# Patient Record
Sex: Male | Born: 1955 | Race: White | Hispanic: No | Marital: Married
Health system: Midwestern US, Academic
[De-identification: ages and names within clinical notes are randomized; demographics above are authoritative.]

---

## 2017-07-09 ENCOUNTER — Encounter: Admit: 2017-07-09 | Discharge: 2017-07-09 | Payer: BC Managed Care – PPO

## 2017-07-09 DIAGNOSIS — I214 Non-ST elevation (NSTEMI) myocardial infarction: Principal | ICD-10-CM

## 2017-07-09 DIAGNOSIS — I2510 Atherosclerotic heart disease of native coronary artery without angina pectoris: ICD-10-CM

## 2017-07-09 DIAGNOSIS — I10 Essential (primary) hypertension: ICD-10-CM

## 2017-07-09 DIAGNOSIS — Z720 Tobacco use: ICD-10-CM

## 2017-07-09 DIAGNOSIS — E785 Hyperlipidemia, unspecified: ICD-10-CM

## 2017-07-17 ENCOUNTER — Encounter: Admit: 2017-07-17 | Discharge: 2017-07-17 | Payer: BC Managed Care – PPO

## 2017-07-17 ENCOUNTER — Ambulatory Visit: Admit: 2017-07-17 | Discharge: 2017-07-18 | Payer: BC Managed Care – PPO

## 2017-07-17 DIAGNOSIS — K089 Disorder of teeth and supporting structures, unspecified: ICD-10-CM

## 2017-07-17 DIAGNOSIS — I10 Essential (primary) hypertension: ICD-10-CM

## 2017-07-17 DIAGNOSIS — I25118 Atherosclerotic heart disease of native coronary artery with other forms of angina pectoris: Principal | ICD-10-CM

## 2017-07-17 DIAGNOSIS — I214 Non-ST elevation (NSTEMI) myocardial infarction: Principal | ICD-10-CM

## 2017-07-17 DIAGNOSIS — R072 Precordial pain: ICD-10-CM

## 2017-07-17 DIAGNOSIS — I739 Peripheral vascular disease, unspecified: ICD-10-CM

## 2017-07-17 DIAGNOSIS — I2510 Atherosclerotic heart disease of native coronary artery without angina pectoris: ICD-10-CM

## 2017-07-17 DIAGNOSIS — Z720 Tobacco use: ICD-10-CM

## 2017-07-17 DIAGNOSIS — E785 Hyperlipidemia, unspecified: ICD-10-CM

## 2017-07-17 DIAGNOSIS — R0989 Other specified symptoms and signs involving the circulatory and respiratory systems: ICD-10-CM

## 2017-07-25 ENCOUNTER — Encounter: Admit: 2017-07-25 | Discharge: 2017-07-25 | Payer: BC Managed Care – PPO

## 2017-07-25 ENCOUNTER — Ambulatory Visit: Admit: 2017-07-25 | Discharge: 2017-07-26 | Payer: BC Managed Care – PPO

## 2017-07-25 DIAGNOSIS — M79609 Pain in unspecified limb: ICD-10-CM

## 2017-07-25 DIAGNOSIS — I214 Non-ST elevation (NSTEMI) myocardial infarction: ICD-10-CM

## 2017-07-25 DIAGNOSIS — Z720 Tobacco use: ICD-10-CM

## 2017-07-25 DIAGNOSIS — I25118 Atherosclerotic heart disease of native coronary artery with other forms of angina pectoris: Principal | ICD-10-CM

## 2017-07-25 DIAGNOSIS — R072 Precordial pain: ICD-10-CM

## 2017-07-25 DIAGNOSIS — E785 Hyperlipidemia, unspecified: ICD-10-CM

## 2017-07-25 DIAGNOSIS — I739 Peripheral vascular disease, unspecified: ICD-10-CM

## 2017-07-25 DIAGNOSIS — I10 Essential (primary) hypertension: ICD-10-CM

## 2017-07-25 DIAGNOSIS — R0989 Other specified symptoms and signs involving the circulatory and respiratory systems: ICD-10-CM

## 2017-07-25 DIAGNOSIS — K089 Disorder of teeth and supporting structures, unspecified: ICD-10-CM

## 2017-07-30 ENCOUNTER — Ambulatory Visit: Admit: 2017-07-30 | Discharge: 2017-07-30 | Payer: BC Managed Care – PPO

## 2017-07-30 ENCOUNTER — Ambulatory Visit: Admit: 2017-07-30 | Discharge: 2017-07-31 | Payer: BC Managed Care – PPO

## 2017-07-30 DIAGNOSIS — R0989 Other specified symptoms and signs involving the circulatory and respiratory systems: ICD-10-CM

## 2017-07-30 DIAGNOSIS — I25118 Atherosclerotic heart disease of native coronary artery with other forms of angina pectoris: Principal | ICD-10-CM

## 2017-07-30 DIAGNOSIS — I739 Peripheral vascular disease, unspecified: ICD-10-CM

## 2017-07-30 DIAGNOSIS — E785 Hyperlipidemia, unspecified: ICD-10-CM

## 2017-07-30 DIAGNOSIS — K089 Disorder of teeth and supporting structures, unspecified: ICD-10-CM

## 2017-07-30 DIAGNOSIS — I10 Essential (primary) hypertension: ICD-10-CM

## 2017-07-30 DIAGNOSIS — I214 Non-ST elevation (NSTEMI) myocardial infarction: ICD-10-CM

## 2017-07-30 DIAGNOSIS — R072 Precordial pain: ICD-10-CM

## 2017-07-30 DIAGNOSIS — Z720 Tobacco use: ICD-10-CM

## 2017-07-30 MED ORDER — REGADENOSON 0.4 MG/5 ML IV SYRG
.4 mg | Freq: Once | INTRAVENOUS | 0 refills | Status: CP
Start: 2017-07-30 — End: ?

## 2017-07-30 MED ORDER — ALBUTEROL SULFATE 90 MCG/ACTUATION IN HFAA
2 | RESPIRATORY_TRACT | 0 refills | Status: DC | PRN
Start: 2017-07-30 — End: 2017-08-04

## 2017-07-30 MED ORDER — AMINOPHYLLINE 500 MG/20 ML IV SOLN
50 mg | INTRAVENOUS | 0 refills | Status: AC | PRN
Start: 2017-07-30 — End: ?

## 2017-07-30 MED ORDER — SODIUM CHLORIDE 0.9 % IV SOLP
250 mL | INTRAVENOUS | 0 refills | Status: AC | PRN
Start: 2017-07-30 — End: ?

## 2017-07-30 MED ORDER — PERFLUTREN LIPID MICROSPHERES 1.1 MG/ML IV SUSP
1-20 mL | Freq: Once | INTRAVENOUS | 0 refills | Status: CP
Start: 2017-07-30 — End: ?

## 2017-07-30 MED ORDER — NITROGLYCERIN 0.4 MG SL SUBL
.4 mg | SUBLINGUAL | 0 refills | Status: AC | PRN
Start: 2017-07-30 — End: ?

## 2017-07-31 ENCOUNTER — Encounter: Admit: 2017-07-31 | Discharge: 2017-07-31 | Payer: BC Managed Care – PPO

## 2018-03-26 ENCOUNTER — Encounter: Admit: 2018-03-26 | Discharge: 2018-03-26 | Payer: BC Managed Care – PPO

## 2018-12-31 ENCOUNTER — Encounter: Admit: 2018-12-31 | Discharge: 2018-12-31

## 2019-01-02 ENCOUNTER — Encounter: Admit: 2019-01-02 | Discharge: 2019-01-02

## 2019-01-02 ENCOUNTER — Ambulatory Visit: Admit: 2019-01-02 | Discharge: 2019-01-03

## 2019-01-02 DIAGNOSIS — I214 Non-ST elevation (NSTEMI) myocardial infarction: Secondary | ICD-10-CM

## 2019-01-02 DIAGNOSIS — E042 Nontoxic multinodular goiter: Secondary | ICD-10-CM

## 2019-01-02 DIAGNOSIS — Z720 Tobacco use: Secondary | ICD-10-CM

## 2019-01-02 DIAGNOSIS — I2510 Atherosclerotic heart disease of native coronary artery without angina pectoris: Secondary | ICD-10-CM

## 2019-01-02 DIAGNOSIS — I25118 Atherosclerotic heart disease of native coronary artery with other forms of angina pectoris: Secondary | ICD-10-CM

## 2019-01-02 DIAGNOSIS — E785 Hyperlipidemia, unspecified: Secondary | ICD-10-CM

## 2019-01-02 DIAGNOSIS — I10 Essential (primary) hypertension: Secondary | ICD-10-CM

## 2019-01-02 DIAGNOSIS — E7800 Pure hypercholesterolemia, unspecified: Secondary | ICD-10-CM

## 2019-01-02 NOTE — Progress Notes
Date of Service: 01/02/2019    Jerry Forbes is a 63 y.o. male.       HPI     Patient is a 63 year old white male with a history of coronary artery disease.  He did suffer a myocardial infarction, he underwent PCI of the LAD on 12/20/2010, due to recurrent chest pain patient underwent another left heart catheterization in April 2013, this test resulted in PCI of the proximal LAD.  He was last seen in the office in February 2019.    Patient does not report symptoms of chest pain, no heart palpitations, he has not taken any sublingual nitroglycerin.    The most recent ischemic evaluation is dated July 30, 2017, it consisted of a myocardial perfusion imaging study, the perfusion pattern was negative for ischemia and ejection fraction was normal at 68%.  In addition, patient was also evaluated with a 2D echo Doppler study which revealed normal left ventricular systolic function, normal diastolic function and no valvular abnormalities.         Vitals:    01/02/19 1045 01/02/19 1048   BP: 128/72 130/72   BP Source: Arm, Right Upper Arm, Left Upper   Pulse: 63    Temp: 36.4 ???C (97.5 ???F)    SpO2: 99%    Weight: 83.9 kg (185 lb)    Height: 1.702 m (5' 7)    PainSc: Zero      Body mass index is 28.98 kg/m???.     Past Medical History  Patient Active Problem List    Diagnosis Date Noted   ??? Essential hypertension 01/21/2015   ??? Chest pain 09/13/2011   ??? Mediastinal abnormality 09/13/2011   ??? Multinodular goiter 09/13/2011     Enlarged Mediastinum on CXR - consistent with multinodular goiter   - CXR on 09/11/11 showed enlargement left superior mediastinum, consistent with thyroid enlargement, LAD, or mass. Increased soft tissue density overlying the right supraclavicular region, possibly LAD.   - Repeat CT 09/13/11 neck demonstrated enlarged heterogenous thyroid containing multiple nodules and masses, consistent with multinodular adenomatous goiter, causing some deviation of the trachea and concordant with soft tissue abnormality on CXR, no neck LAD   - CT chest - tiny left lower lobe pulmonary nodule - likely a small granuloma/scar. In a low risk patient no further follow up recommended   - TSH wnl this admit   - endo consulted for further tests, likely need to establish care with endo as outpatient as well   - given tobacco history (chew) will have pt follow up with PCP regarding the tiny left lower lobe nodule       ??? Coronary artery disease 05/18/2011     LHC 12/21/10:Subtotal 99 percent proximal LAD lesion, status post successful PCI with 2 overlapping drug-eluting stents, Xience 3.0 x 15 mm and a Xience 2.5 x 15 mm postdilated to 3.25 mm with excellent resul Normal LVEDP. Normal LV systolic function with an EF of 55 percent    LHC 09/12/11:A 95 percent proximal LAD lesion status post PCI with a Xience 3.5 x 12 mm overlapping with the previously placed proximal LAD stent and postdilated with a 3.5 mm balloon with excellent results, Mild disease in the left circumflex and the distal LAD.  Elevated LVEDP, Normal LV systolic function, EF of 55 percent.       ??? Tobacco chew use 01/17/2011   ??? Hyperlipemia 01/17/2011   ??? Poor dentition 01/17/2011   ??? NSTEMI (non-ST elevated myocardial infarction) (HCC) 12/20/2010   ???  HTN (hypertension) 12/20/2010         Review of Systems   Constitution: Negative.   HENT: Negative.    Eyes: Negative.    Cardiovascular: Negative.    Respiratory: Negative.    Endocrine: Negative.    Hematologic/Lymphatic: Negative.    Skin: Negative.    Musculoskeletal: Negative.    Gastrointestinal: Negative.    Genitourinary: Negative.    Neurological: Negative.    Psychiatric/Behavioral: Negative.    Allergic/Immunologic: Negative.        Physical Exam  General Appearance: normal in appearance  Skin: warm, moist, no ulcers or xanthomas  Eyes: conjunctivae and lids normal, pupils are equal and round  Lips & Oral Mucosa: no pallor or cyanosis Neck Veins: neck veins are flat, neck veins are not distended  Chest Inspection: chest is normal in appearance  Respiratory Effort: breathing comfortably, no respiratory distress  Auscultation/Percussion: lungs clear to auscultation, no rales or rhonchi, no wheezing  Cardiac Rhythm: regular rhythm and normal rate  Cardiac Auscultation: S1, S2 normal, no rub, no gallop  Murmurs: no murmur  Carotid Arteries: normal carotid upstroke bilaterally, no bruit  Lower Extremity Edema: no lower extremity edema  Abdominal Exam: soft, non-tender, no masses, bowel sounds normal  Liver & Spleen: no organomegaly  Language and Memory: patient responsive and seems to comprehend information  Neurologic Exam: neurological assessment grossly intact      Cardiovascular Studies  Twelve-lead EKG demonstrated normal sinus rhythm, no ST segment T wave changes    Problems Addressed Today  Encounter Diagnoses   Name Primary?   ??? NSTEMI (non-ST elevated myocardial infarction) (HCC) Yes   ??? Pure hypercholesterolemia    ??? Essential hypertension    ??? Coronary artery disease involving native coronary artery of native heart with other form of angina pectoris (HCC)    ??? CAD S/P percutaneous coronary angioplasty    ??? Tobacco chew use    ??? Multinodular goiter        Assessment and Plan     In summary: This is a 63 year old male with coronary artery disease, PCI of the LAD on 12/20/2010 and also 09/14/2011, patient was last evaluated with a perfusion imaging study in February 2019, the perfusion pattern was negative for ischemia.    He has not had recurrent symptoms of chest pain and no symptoms compatible with angina, patient remained stable.    Plan:    1.  Continue all current medications  2.  I did suggest risk factors modification, low-fat, low-cholesterol, low sugar, low carbohydrate and low salt diet  3.  Fasting lipid and liver profile and Chem-7  4.  Patient will be evaluated with a 2D echo Doppler study and a perfusion imaging study in March 2021, we will follow-up on the results and will call with further recommendations, due to busy harvest season, he will see me back in the office in July 2021.         Current Medications (including today's revisions)  ??? aspirin EC (ASPIRIN LOW DOSE) 81 mg tablet Take 1 Tab by mouth daily.   ??? clopiDOGrel (PLAVIX) 75 mg tablet TAKE ONE TABLET BY MOUTH ONCE DAILY   ??? lisinopril (PRINIVIL; ZESTRIL) 20 mg tablet Take 20 mg by mouth daily.     ??? meloxicam (MOBIC) 15 mg tablet Take 15 mg by mouth daily.   ??? metoprolol (LOPRESSOR) 50 mg tablet Take 1 Tab by mouth twice daily.   ??? nitroglycerin (NITROSTAT) 0.4 mg tablet Place 0.4 mg  under tongue every 5 minutes as needed.     ??? ranitidine(+) (ZANTAC) 150 mg tablet Take 150 mg by mouth twice daily.   ??? rosuvastatin (CRESTOR) 5 mg tablet Take 2 Tabs by mouth daily.   ??? tadalafil(+) (CIALIS) 10 mg tablet Take 10 mg by mouth as Needed.

## 2020-01-27 ENCOUNTER — Encounter: Admit: 2020-01-27 | Discharge: 2020-01-27 | Payer: BC Managed Care – PPO

## 2020-02-05 ENCOUNTER — Encounter: Admit: 2020-02-05 | Discharge: 2020-02-05 | Payer: BC Managed Care – PPO

## 2020-02-05 DIAGNOSIS — I10 Essential (primary) hypertension: Secondary | ICD-10-CM

## 2020-02-05 DIAGNOSIS — E7800 Pure hypercholesterolemia, unspecified: Secondary | ICD-10-CM

## 2020-02-05 DIAGNOSIS — Z720 Tobacco use: Secondary | ICD-10-CM

## 2020-02-05 DIAGNOSIS — E785 Hyperlipidemia, unspecified: Secondary | ICD-10-CM

## 2020-02-05 DIAGNOSIS — I2510 Atherosclerotic heart disease of native coronary artery without angina pectoris: Secondary | ICD-10-CM

## 2020-02-05 DIAGNOSIS — K089 Disorder of teeth and supporting structures, unspecified: Secondary | ICD-10-CM

## 2020-02-05 DIAGNOSIS — I214 Non-ST elevation (NSTEMI) myocardial infarction: Secondary | ICD-10-CM

## 2020-02-05 DIAGNOSIS — Z289 Immunization not carried out for unspecified reason: Secondary | ICD-10-CM

## 2020-02-05 DIAGNOSIS — I25118 Atherosclerotic heart disease of native coronary artery with other forms of angina pectoris: Secondary | ICD-10-CM

## 2020-02-05 NOTE — Progress Notes
Date of Service: 02/05/2020    Jerry Forbes is a 64 y.o. male.       HPI     Patient is a 64 year old white male that has a history of coronary artery disease.    Patient did suffer an MI, he underwent PCI of the LAD on 12/20/2010.  He did experience recurrent chest pain, he underwent another LHC in April 2013, this test resulted in PCI of the proximal LAD.  He has been on dual antiplatelet therapy ever since and would like to continue it.    He has not had any recurrent chest pain currently, has not had angina and has not taken sublingual nitroglycerin.  He was last evaluated with a perfusion imaging study in February 2019, the tomographic pattern did not demonstrate ischemia, the systolic function was normal.  An echocardiogram performed at the same time was overall unremarkable as well.    Patient complains of having bilateral knee pain due to osteoarthritis.  He does not report chest pain, no heart palpitations, has not taken sublingual nitroglycerin.         Vitals:    02/05/20 1610   BP: 136/82   Weight: 85.7 kg (189 lb)   Height: 1.702 m (5' 7)   PainSc: Zero     Body mass index is 29.6 kg/m?Marland Kitchen     Past Medical History  Patient Active Problem List    Diagnosis Date Noted   ? CAD S/P percutaneous coronary angioplasty    ? Essential hypertension 01/21/2015   ? Chest pain 09/13/2011   ? Mediastinal abnormality 09/13/2011   ? Multinodular goiter 09/13/2011     Enlarged Mediastinum on CXR - consistent with multinodular goiter   - CXR on 09/11/11 showed enlargement left superior mediastinum, consistent with thyroid enlargement, LAD, or mass. Increased soft tissue density overlying the right supraclavicular region, possibly LAD.   - Repeat CT 09/13/11 neck demonstrated enlarged heterogenous thyroid containing multiple nodules and masses, consistent with multinodular adenomatous goiter, causing some deviation of the trachea and concordant with soft tissue abnormality on CXR, no neck LAD   - CT chest - tiny left lower lobe pulmonary nodule - likely a small granuloma/scar. In a low risk patient no further follow up recommended   - TSH wnl this admit   - endo consulted for further tests, likely need to establish care with endo as outpatient as well   - given tobacco history (chew) will have pt follow up with PCP regarding the tiny left lower lobe nodule       ? Coronary artery disease 05/18/2011     LHC 12/21/10:Subtotal 99 percent proximal LAD lesion, status post successful PCI with 2 overlapping drug-eluting stents, Xience 3.0 x 15 mm and a Xience 2.5 x 15 mm postdilated to 3.25 mm with excellent resul Normal LVEDP. Normal LV systolic function with an EF of 55 percent    LHC 09/12/11:A 95 percent proximal LAD lesion status post PCI with a Xience 3.5 x 12 mm overlapping with the previously placed proximal LAD stent and postdilated with a 3.5 mm balloon with excellent results, Mild disease in the left circumflex and the distal LAD.  Elevated LVEDP, Normal LV systolic function, EF of 55 percent.       ? Tobacco chew use 01/17/2011   ? Hyperlipemia 01/17/2011   ? Poor dentition 01/17/2011   ? NSTEMI (non-ST elevated myocardial infarction) (HCC) 12/20/2010   ? HTN (hypertension) 12/20/2010  Review of Systems   Constitutional: Negative.   HENT: Negative.    Eyes: Negative.    Cardiovascular: Negative.    Respiratory: Negative.    Endocrine: Negative.    Hematologic/Lymphatic: Negative.    Skin: Negative.    Musculoskeletal: Negative.    Gastrointestinal: Negative.    Genitourinary: Negative.    Neurological: Negative.    Psychiatric/Behavioral: Negative.    Allergic/Immunologic: Negative.        Physical Exam  General Appearance: normal in appearance  Skin: warm, moist, no ulcers or xanthomas  Eyes: conjunctivae and lids normal, pupils are equal and round  Lips & Oral Mucosa: no pallor or cyanosis  Neck Veins: neck veins are flat, neck veins are not distended  Chest Inspection: chest is normal in appearance  Respiratory Effort: breathing comfortably, no respiratory distress  Auscultation/Percussion: lungs clear to auscultation, no rales or rhonchi, no wheezing  Cardiac Rhythm: regular rhythm and normal rate  Cardiac Auscultation: S1, S2 normal, no rub, no gallop  Murmurs: no murmur  Carotid Arteries: normal carotid upstroke bilaterally, no bruit  Lower Extremity Edema: no lower extremity edema  Abdominal Exam: soft, non-tender, no masses, bowel sounds normal  Liver & Spleen: no organomegaly  Language and Memory: patient responsive and seems to comprehend information  Neurologic Exam: neurological assessment grossly intact      Cardiovascular Studies  Twelve-lead EKG demonstrated normal sinus rhythm, no ST segment T wave changes    Problems Addressed Today  Encounter Diagnoses   Name Primary?   ? CAD S/P percutaneous coronary angioplasty Yes   ? Coronary artery disease involving native coronary artery of native heart with other form of angina pectoris (HCC)    ? Essential hypertension    ? Pure hypercholesterolemia    ? NSTEMI (non-ST elevated myocardial infarction) (HCC)    ? Poor dentition    ? Tobacco chew use    ? COVID-19 vaccine dose not administered        Assessment and Plan     In summary: This is a 64 year old white male with stable CAD, s/p PCI of the LAD on 12/20/2010, history of recurrent chest pain is s/p repeat heart catheterization and PCI to the proximal LAD on 09/14/2011.  Patient remained angina free and has not taken sublingual nitroglycerin.  A perfusion imaging study performed in February 2019 did not demonstrate ischemia, he is known to have normal LV systolic function.    Plan:    1.  Continue current medications  2.  Goal LDL is 70 mg/dL or lower  3.  Patient would like to continue the dual antiplatelet therapy  4.  Further evaluation with a 2D echo Doppler study and perfusion imaging study in November?December 2021 between Thanksgiving and Christmas?we will follow up on the results of the test and will make further recommendations.  5.  Follow-up office visit in approximately 1 year.8         Current Medications (including today's revisions)  ? aspirin EC (ASPIRIN LOW DOSE) 81 mg tablet Take 1 Tab by mouth daily.   ? clopiDOGrel (PLAVIX) 75 mg tablet TAKE ONE TABLET BY MOUTH ONCE DAILY   ? lisinopril (PRINIVIL; ZESTRIL) 20 mg tablet Take 20 mg by mouth daily.     ? meloxicam (MOBIC) 15 mg tablet Take 15 mg by mouth daily.   ? metoprolol (LOPRESSOR) 50 mg tablet Take 1 Tab by mouth twice daily.   ? nitroglycerin (NITROSTAT) 0.4 mg tablet Place 0.4 mg under tongue every  5 minutes as needed.     ? ranitidine(+) (ZANTAC) 150 mg tablet Take 150 mg by mouth twice daily.   ? rosuvastatin (CRESTOR) 5 mg tablet Take 2 Tabs by mouth daily.   ? tadalafil(+) (CIALIS) 10 mg tablet Take 10 mg by mouth as Needed.

## 2020-08-30 IMAGING — CR UP_EXM
3 series · 3 of 3 positions shown · non-contrast
Comparison: none

[elbow ap]
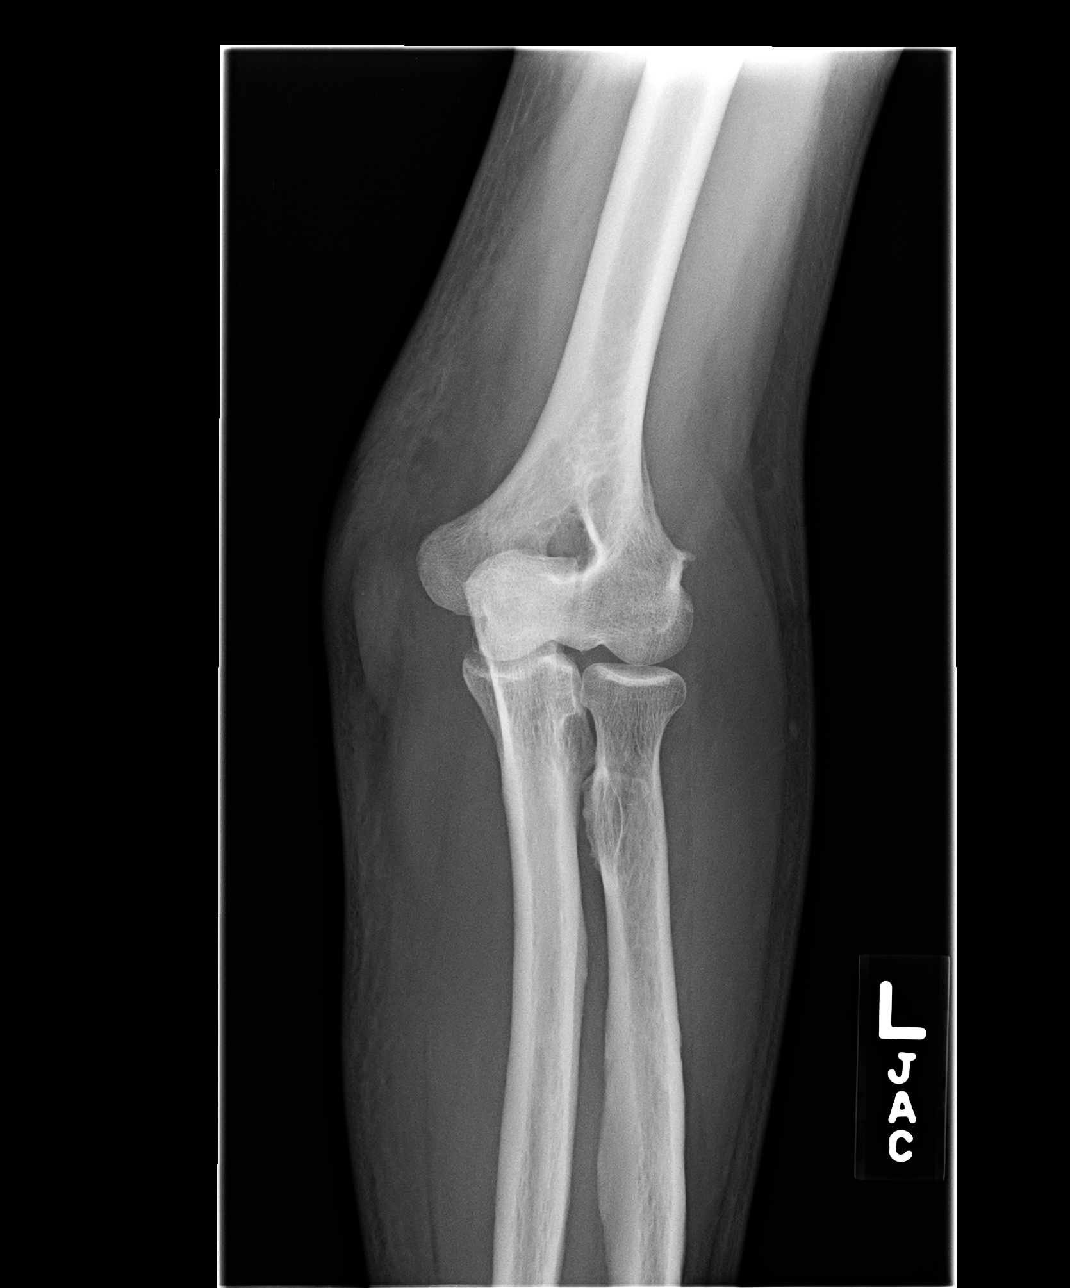

[elbow obl]
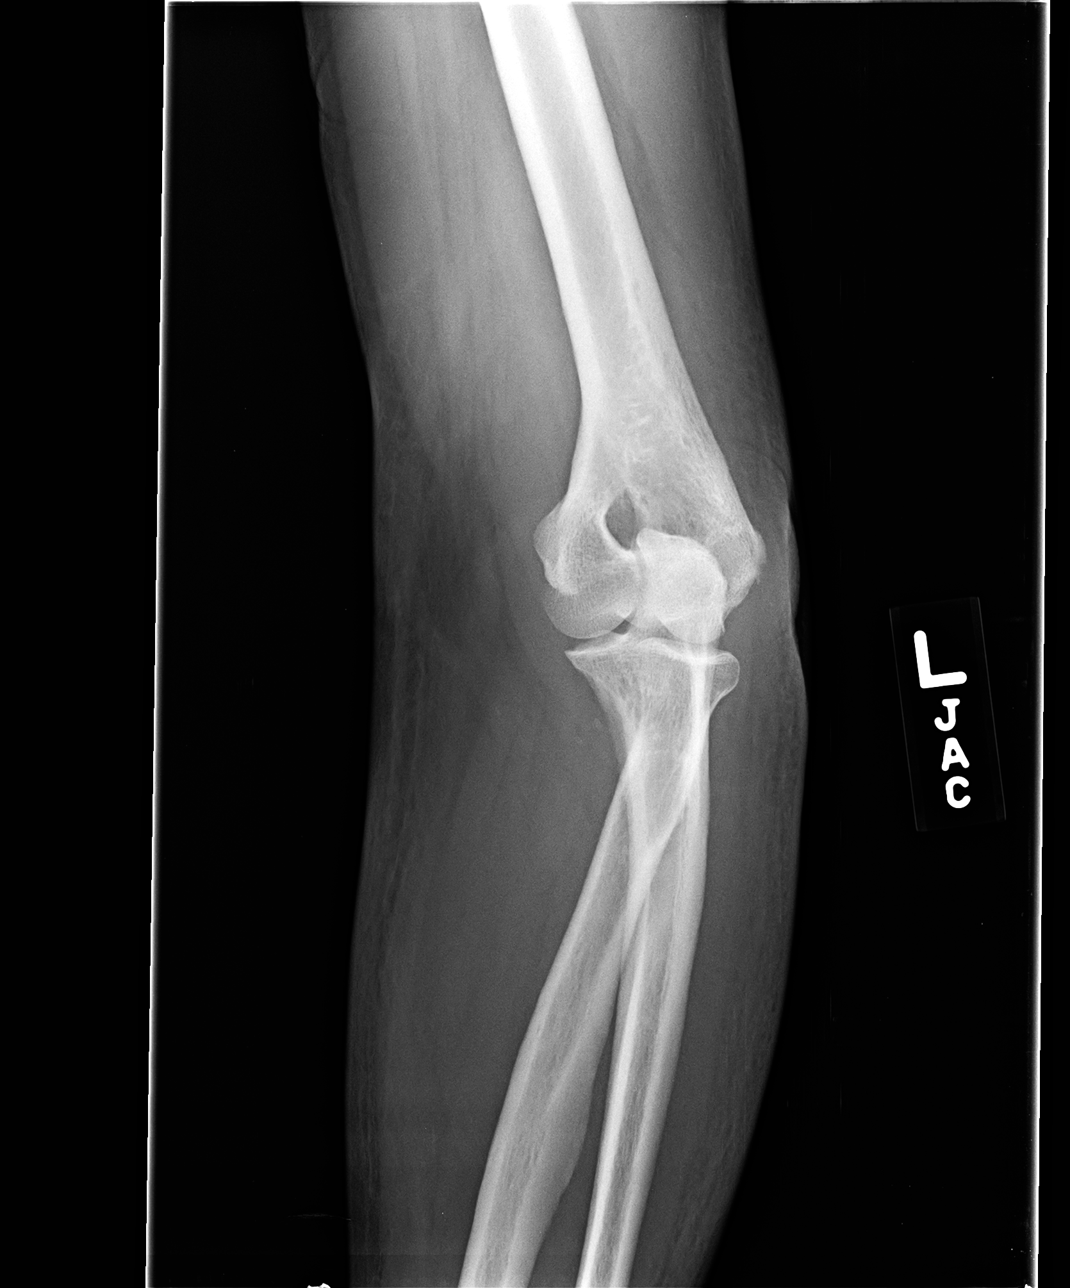

[elbow lat]
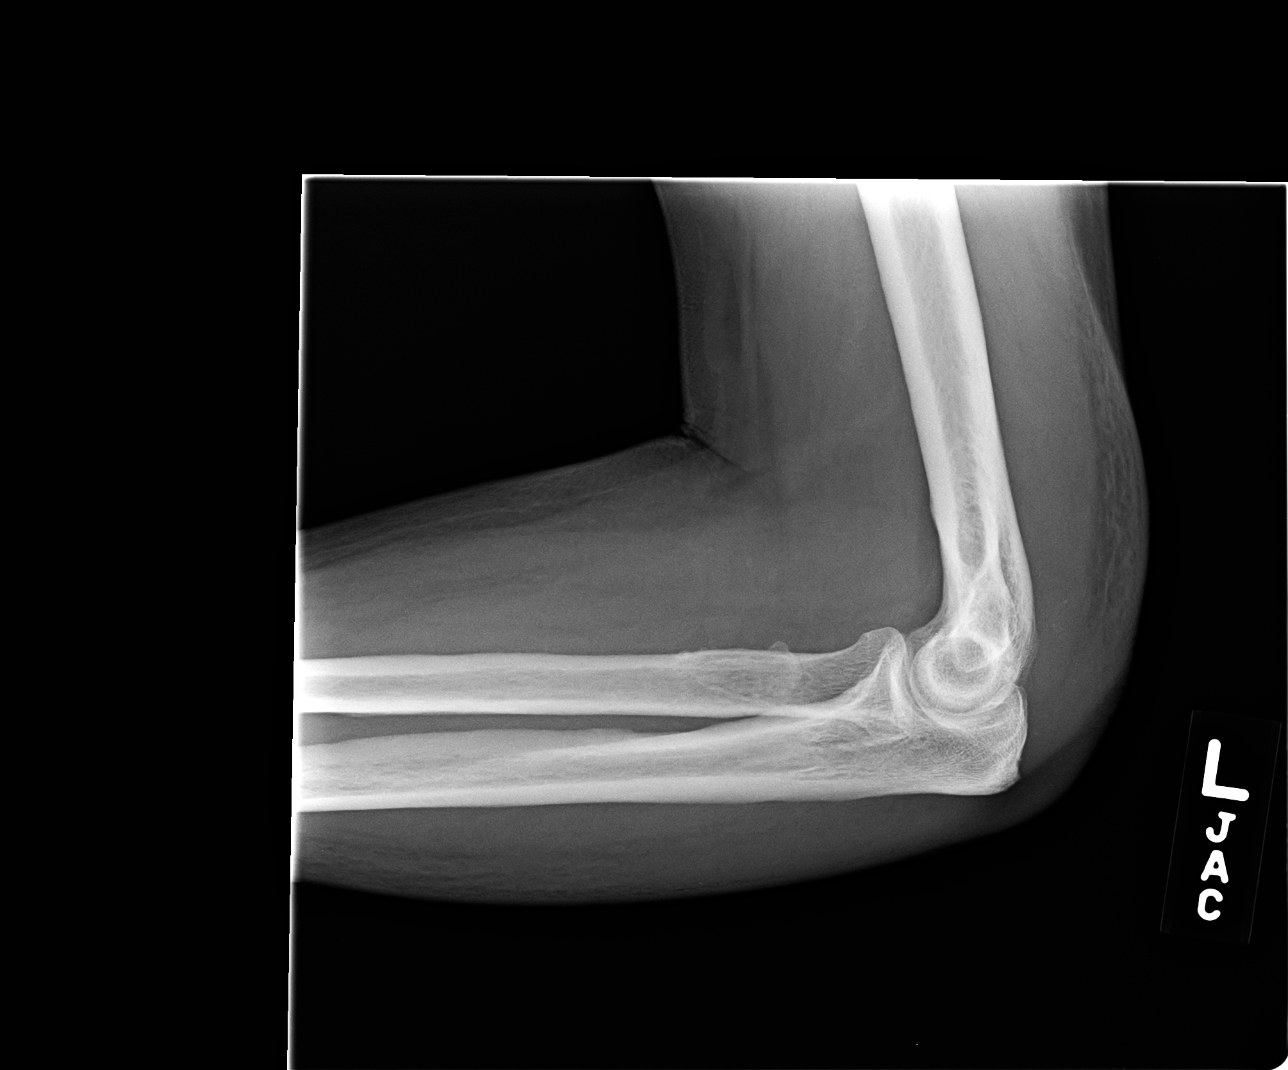

[3 of 3 positions shown; findings below may reference images not displayed]

EXAM

RADIOLOGICAL EXAMINATION, ELBOW; 3 VIEWS OR MORE COMPLETE CPT 57575

INDICATION

Patient complains of elbow pain in posterior LT elbow x 2 weeks. Pt states injury from pushing
tire gauge on tire. Patient states there is swelling. JC/ME

TECHNIQUE

4 views of the left elbow were acquired.

COMPARISONS

There are no previous examinations available for comparison at the time of dictation.

FINDINGS

There are no fractures or subluxations of the left elbow. There are no abnormal masses or
calcifications. There are no blastic or lytic lesions.

There is soft tissue swelling identified along the posterior aspect of the left arm distally. There
is no evidence of an elbow joint effusion.

Enthesopathy is seen in the lateral humeral condyle. There is osteophytic spurring along the
radiocapitellar and ulnotrochlear joints.

IMPRESSION

Soft tissue swelling along the posterior aspect of the distal left arm. Mild degenerative changes
along the radiocapitellar and ulnotrochlear joints.

Tech Notes:

Pt c/o pain in posterior LT elbow x 2 weeks. Pt states injury from pushing tire gauge on tire. pt
states swelling. JC/ME

## 2021-01-25 ENCOUNTER — Encounter: Admit: 2021-01-25 | Discharge: 2021-01-25 | Payer: BC Managed Care – PPO

## 2021-01-25 NOTE — Progress Notes
Records Request-STAT    Medical records request for continuation of care:    Patient has appointment on 8.18.22   with  Dr. Avie Arenas .    Please fax records to [facility] [place] [facility] [phone]    Request records:      Most Recent Labs        Thank you,      Cardiovascular Medicine  [facility]  [address]  [place], [place] [address]  Phone:  [phone]  Fax:  [phone]

## 2021-01-27 ENCOUNTER — Encounter: Admit: 2021-01-27 | Discharge: 2021-01-27 | Payer: MEDICARE

## 2021-01-27 DIAGNOSIS — Z720 Tobacco use: Secondary | ICD-10-CM

## 2021-01-27 DIAGNOSIS — E785 Hyperlipidemia, unspecified: Secondary | ICD-10-CM

## 2021-01-27 DIAGNOSIS — I214 Non-ST elevation (NSTEMI) myocardial infarction: Secondary | ICD-10-CM

## 2021-01-27 DIAGNOSIS — I2510 Atherosclerotic heart disease of native coronary artery without angina pectoris: Secondary | ICD-10-CM

## 2021-01-27 DIAGNOSIS — I25118 Atherosclerotic heart disease of native coronary artery with other forms of angina pectoris: Secondary | ICD-10-CM

## 2021-01-27 DIAGNOSIS — I10 Essential (primary) hypertension: Secondary | ICD-10-CM

## 2021-01-27 DIAGNOSIS — E7800 Pure hypercholesterolemia, unspecified: Secondary | ICD-10-CM

## 2021-01-27 DIAGNOSIS — Z01810 Encounter for preprocedural cardiovascular examination: Secondary | ICD-10-CM

## 2021-01-27 NOTE — Progress Notes
Date of Service: 01/27/2021    Jerry Forbes is a 65 y.o. male.       HPI     Jerry Forbes 65 year old is a 58 white male with a history of coronary artery disease, history of an MI and PCI to LAD in July 2012, history of recurrent chest pain, status post repeat left heart catheterization April 2013, and at that time PCI of the proximal LAD, initially started on prasugrel and then later on was continued on clopidogrel along with aspirin, patient also has a history of hypertension, hyperlipidemia, multinodular goiter, osteoarthritis anticipating that he will undergo knee surgery by the end of this year.    Patient does not report any symptoms of heart palpitations, no presyncope or syncope.  He is compliant with his medical regimen.       Vitals:    01/27/21 1104   BP: 138/82   BP Source: Arm, Left Upper   Pulse: 62   SpO2: 99%   O2 Device: None (Room air)   PainSc: Zero   Weight: 83 kg (183 lb)   Height: 170.2 cm (5' 7)     Body mass index is 28.66 kg/m?Marland Kitchen     Past Medical History  Patient Active Problem List    Diagnosis Date Noted   ? COVID-19 vaccine dose not administered 02/05/2020   ? CAD S/P percutaneous coronary angioplasty    ? Essential hypertension 01/21/2015   ? Chest pain 09/13/2011   ? Mediastinal abnormality 09/13/2011   ? Multinodular goiter 09/13/2011     Enlarged Mediastinum on CXR - consistent with multinodular goiter   - CXR on 09/11/11 showed enlargement left superior mediastinum, consistent with thyroid enlargement, LAD, or mass. Increased soft tissue density overlying the right supraclavicular region, possibly LAD.   - Repeat CT 09/13/11 neck demonstrated enlarged heterogenous thyroid containing multiple nodules and masses, consistent with multinodular adenomatous goiter, causing some deviation of the trachea and concordant with soft tissue abnormality on CXR, no neck LAD   - CT chest - tiny left lower lobe pulmonary nodule - likely a small granuloma/scar. In a low risk patient no further follow up recommended   - TSH wnl this admit   - endo consulted for further tests, likely need to establish care with endo as outpatient as well   - given tobacco history (chew) will have pt follow up with PCP regarding the tiny left lower lobe nodule       ? Coronary artery disease 05/18/2011     LHC 12/21/10:Subtotal 99 percent proximal LAD lesion, status post successful PCI with 2 overlapping drug-eluting stents, Xience 3.0 x 15 mm and a Xience 2.5 x 15 mm postdilated to 3.25 mm with excellent resul Normal LVEDP. Normal LV systolic function with an EF of 55 percent    LHC 09/12/11:A 95 percent proximal LAD lesion status post PCI with a Xience 3.5 x 12 mm overlapping with the previously placed proximal LAD stent and postdilated with a 3.5 mm balloon with excellent results, Mild disease in the left circumflex and the distal LAD.  Elevated LVEDP, Normal LV systolic function, EF of 55 percent.       ? Tobacco chew use 01/17/2011   ? Hyperlipemia 01/17/2011   ? Poor dentition 01/17/2011   ? NSTEMI (non-ST elevated myocardial infarction) (HCC) 12/20/2010   ? HTN (hypertension) 12/20/2010         Review of Systems   Constitutional: Negative.   HENT: Negative.    Eyes: Negative.  Cardiovascular: Negative.    Respiratory: Negative.    Endocrine: Negative.    Hematologic/Lymphatic: Negative.    Skin: Negative.    Musculoskeletal: Negative.    Gastrointestinal: Negative.    Genitourinary: Negative.    Neurological: Negative.    Psychiatric/Behavioral: Negative.    Allergic/Immunologic: Negative.        Physical Exam    General Appearance: normal in appearance  Skin: warm, moist, no ulcers or xanthomas  Eyes: conjunctivae and lids normal, pupils are equal and round  Lips & Oral Mucosa: no pallor or cyanosis  Neck Veins: neck veins are flat, neck veins are not distended  Chest Inspection: chest is normal in appearance  Respiratory Effort: breathing comfortably, no respiratory distress  Auscultation/Percussion: lungs clear to auscultation, no rales or rhonchi, no wheezing  Cardiac Rhythm: regular rhythm and normal rate  Cardiac Auscultation: S1, S2 normal, no rub, no gallop  Murmurs: no murmur  Carotid Arteries: normal carotid upstroke bilaterally, no bruit  Lower Extremity Edema: no lower extremity edema  Abdominal Exam: soft, non-tender, no masses, bowel sounds normal  Liver & Spleen: no organomegaly  Language and Memory: patient responsive and seems to comprehend information  Neurologic Exam: neurological assessment grossly intact    Cardiovascular Studies  Twelve-lead EKG demonstrates sinus bradycardia, ventricular rate 58 bpm, no axis deviation, PR interval, normal QT/QTc    Cardiovascular Health Factors  Vitals BP Readings from Last 3 Encounters:   01/27/21 138/82   02/05/20 136/82   01/02/19 130/72     Wt Readings from Last 3 Encounters:   01/27/21 83 kg (183 lb)   02/05/20 85.7 kg (189 lb)   01/02/19 83.9 kg (185 lb)     BMI Readings from Last 3 Encounters:   01/27/21 28.66 kg/m?   02/05/20 29.60 kg/m?   01/02/19 28.98 kg/m?      Smoking Social History     Tobacco Use   Smoking Status Never Smoker   Smokeless Tobacco Current User   ? Types: Chew      Lipid Profile Cholesterol   Date Value Ref Range Status   05/07/2020 147  Final     HDL   Date Value Ref Range Status   05/07/2020 67  Final     LDL   Date Value Ref Range Status   05/07/2020 62  Final     Triglycerides   Date Value Ref Range Status   05/07/2020 97  Final      Blood Sugar Hemoglobin A1C   Date Value Ref Range Status   05/07/2020 6.1 (H) <5.7 Final     Glucose   Date Value Ref Range Status   05/07/2020 126 (H) 65 - 99 Final   06/13/2017 105 (H) 65 - 99 Final   02/02/2015 135 (H) 65 - 99 Final          Problems Addressed Today  Encounter Diagnoses   Name Primary?   ? CAD S/P percutaneous coronary angioplasty Yes   ? Coronary artery disease involving native coronary artery of native heart with other form of angina pectoris (HCC)    ? Essential hypertension    ? Primary hypertension    ? Pure hypercholesterolemia    ? NSTEMI (non-ST elevated myocardial infarction) (HCC)    ? Tobacco chew use    ? Preop cardiovascular exam        Assessment and Plan     Assessment:    1.  Preoperative evaluation preceding knee replacement- this orthopedic surgery  will be performed later this year  2.  History of myocardial infarction  3.  Status post PCI to LAD in July 2012  4.  History of recurrent chest pain  5.  Status post repeat left heart catheterization and PCI to proximal LAD in April 2013-no recurrent symptoms of angina, no use of sublingual nitroglycerin, patient has been continued on dual antiplatelet therapy, he was initially on prasugrel and then was switched to clopidogrel  6.  Primary hypertension-under good control  7.  Hyperlipidemia-on statin therapy  8.  Osteoarthritis-patient anticipates that he will undergo knee replacement later this year    Plan:    1.  Continue current medications  2.  Patient will be further evaluated with a 2D echo Doppler study and the perfusion imaging study, we will follow-up on the results of this test and will make further recommendations  3.  Follow-up office visit in 10 to 12 months    Total Time Today was 30 minutes in the following activities: Preparing to see the patient, Obtaining and/or reviewing separately obtained history, Performing a medically appropriate examination and/or evaluation, Counseling and educating the patient/family/caregiver, Ordering medications, tests, or procedures, Referring and communication with other health care professionals (when not separately reported), Documenting clinical information in the electronic or other health record, Independently interpreting results (not separately reported) and communicating results to the patient/family/caregiver and Care coordination (not separately reported)         Current Medications (including today's revisions)  ? aspirin EC (ASPIRIN LOW DOSE) 81 mg tablet Take 1 Tab by mouth daily.   ? clopiDOGrel (PLAVIX) 75 mg tablet TAKE ONE TABLET BY MOUTH ONCE DAILY   ? lisinopril (PRINIVIL; ZESTRIL) 20 mg tablet Take 20 mg by mouth daily.     ? meloxicam (MOBIC) 15 mg tablet Take 15 mg by mouth daily.   ? metoprolol (LOPRESSOR) 50 mg tablet Take 1 Tab by mouth twice daily.   ? nitroglycerin (NITROSTAT) 0.4 mg tablet Place 0.4 mg under tongue every 5 minutes as needed.     ? ranitidine(+) (ZANTAC) 150 mg tablet Take 150 mg by mouth twice daily.   ? rosuvastatin (CRESTOR) 20 mg tablet Take 20 mg by mouth daily.   ? rosuvastatin (CRESTOR) 5 mg tablet Take 2 Tabs by mouth daily.   ? tadalafiL (CIALIS) 10 mg tablet Take 10 mg by mouth as Needed.

## 2022-02-02 ENCOUNTER — Encounter: Admit: 2022-02-02 | Discharge: 2022-02-02 | Payer: MEDICARE

## 2022-02-16 ENCOUNTER — Encounter: Admit: 2022-02-16 | Discharge: 2022-02-16 | Payer: MEDICARE

## 2022-02-16 DIAGNOSIS — M79606 Pain in leg, unspecified: Secondary | ICD-10-CM

## 2022-02-16 DIAGNOSIS — Z720 Tobacco use: Secondary | ICD-10-CM

## 2022-02-16 DIAGNOSIS — I10 Essential (primary) hypertension: Secondary | ICD-10-CM

## 2022-02-16 DIAGNOSIS — Z01810 Encounter for preprocedural cardiovascular examination: Secondary | ICD-10-CM

## 2022-02-16 DIAGNOSIS — I2510 Atherosclerotic heart disease of native coronary artery without angina pectoris: Secondary | ICD-10-CM

## 2022-02-16 DIAGNOSIS — I214 Non-ST elevation (NSTEMI) myocardial infarction: Secondary | ICD-10-CM

## 2022-02-16 DIAGNOSIS — E785 Hyperlipidemia, unspecified: Secondary | ICD-10-CM

## 2022-02-16 DIAGNOSIS — I739 Peripheral vascular disease, unspecified: Secondary | ICD-10-CM

## 2022-02-16 DIAGNOSIS — M79604 Pain in right leg: Secondary | ICD-10-CM

## 2022-02-16 MED ORDER — LISINOPRIL 40 MG PO TAB
40 mg | ORAL_TABLET | Freq: Every day | ORAL | 4 refills | Status: AC
Start: 2022-02-16 — End: ?

## 2022-02-16 MED ORDER — ROSUVASTATIN 10 MG PO TAB
10 mg | ORAL_TABLET | Freq: Every day | ORAL | 1 refills | 90.00000 days | Status: AC
Start: 2022-02-16 — End: ?

## 2022-02-16 MED ORDER — METOPROLOL SUCCINATE 100 MG PO TB24
100 mg | ORAL_TABLET | Freq: Every day | ORAL | 1 refills | 90.00000 days | Status: AC
Start: 2022-02-16 — End: ?

## 2022-02-16 NOTE — Patient Instructions
Heart and vascular findings appear stable.  May stop aspirin.  We will change to tablets of rosuvastatin 10 mg daily, Toprol-XL 100 mg p.o. daily, and lisinopril 40 mg p.o. daily  We will arrange for a cardiac stress test and notify you about results.  Routine follow-up will be planned for 6 to 12 months.    [facility] Nuclear Stress Test Instructions    PLEASE REPORT TO:    ____[facility] [phone], [place], [place]) - [phone]   ____[facility] ([address], [address], [place], [place]) - [phone]    ____[facility] ([address] [place], [place]) - [phone]    ____[facility] ([address], [address], [place], [place]) - [phone]   ____[facility] ([address] [place], [place] ) - [phone]     [facility] Main Phone Number: [phone]     Date of Test  ____________  at ____________  for ________________________    Are you able to raise your arm up by your head for about 20 minutes? yes  Can you lie on your back for approximately 20 minutes with minimal movement? yes     The Thallium evaluation has two parts -- two nuclear scans.   The first scan is done in the morning and the second three to four hours later.     Wear comfortable clothing. Bring or wear comfortable walking shoes.     It is recommended not to hold infants for 2 to 3 days after the test.   Please let the nuclear technologists know if you plan on flying after the test.     NO DECAF OR CAFFEINE 24 HOURS PRIOR TO TEST. Examples: coffee, tea, decaf coffee or tea, cola, chocolate.     DO NOT EAT OR DRINK THE MORNING OF YOUR TEST unless otherwise instructed. (You may have a couple sips of water.) If you are a diabetic, if insulin dependent: please take one third of your insulin with a light breakfast (two pieces of dry toast and a small juice). Bring insulin and medication with you to the test.     ___ TAKE MORNING MEDICATIONS WITH A COUPLE SIPS OF WATER PRIOR TO TEST.     Hold all vitamins and supplements on the morning of your test.     HOLD THE FOLLOWING MEDICATIONS AS INDICATED BELOW:               WHAT TO DO BETWEEN THE FIRST TWO THALLIUM SCANS:  1. No strenuous exercise should be performed during this time.  2. A light lunch is permissible. The technologist will give you a list of appropriate foods.  3. Please return 15 minutes prior to the schedule of your second scan. Our nuclear technologist will tell you exactly what time to return.  4. Please do not use tobacco products in between scans.  5. After the first scan is completed, you may resume usual medications.     TEST FINDINGS:  You will receive the results of the test within 7 business days of its completion by telephone, unless arranged differently at the time of the procedure.  If you have any questions concerning your thallium test or if you do not hear from your [facility] physician/or nurse within 7 business days, please call the appropriate office checked above.

## 2022-02-16 NOTE — Progress Notes
Date of Service: 02/16/2022    Jerry Forbes is a 66 y.o. male.       HPI     Patient is a 66 year old Caucasian male past medical history of hypertension with hypertensive heart disease, status post anterior myocardial infarction 2012 which led to stenting to the LAD.  Had recurrent anginal type symptoms and subsequent stent and stent intervention in 2013.  He otherwise has preserved left ventricular ejection fraction and clinically has done well on medical therapy.  Reports difficulty with myalgias with higher dose statin, currently tolerates the rosuvastatin without any difficulty.  Does have pretty significant gastroesophageal reflux disease and notes that a few weeks ago would get some epigastric discomfort that would radiate more to the right that seem to be relieved with belching.  This was not provoked by certain stressors or activities and has not reoccurred for at least 1 week.  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  Does have hopes of pursuing potential intervention to the legs and knees as he gets towards wintertime and completes calving season.  No other chest pains or palpitations.  No orthopnea or PND.  Does get leg cramps at times especially when is more ambulatory and with any significant change in weather especially cold, will note that his feet get purplish.         Vitals:    02/16/22 1050   BP: (!) 146/82   BP Source: Arm, Left Upper   Pulse: 61   SpO2: 97%   O2 Percent: 97 %   O2 Device: None (Room air)   PainSc: Zero   Weight: 84.5 kg (186 lb 3.2 oz)   Height: 170.2 cm (5' 7)     Body mass index is 29.16 kg/m?Marland Kitchen     Past Medical History  Patient Active Problem List    Diagnosis Date Noted   ? Preop cardiovascular exam 01/27/2021   ? COVID-19 vaccine dose not administered 02/05/2020   ? CAD S/P percutaneous coronary angioplasty    ? Essential hypertension 01/21/2015   ? Chest pain 09/13/2011   ? Mediastinal abnormality 09/13/2011   ? Multinodular goiter 09/13/2011     Enlarged Mediastinum on CXR - consistent with multinodular goiter   - CXR on 09/11/11 showed enlargement left superior mediastinum, consistent with thyroid enlargement, LAD, or mass. Increased soft tissue density overlying the right supraclavicular region, possibly LAD.   - Repeat CT 09/13/11 neck demonstrated enlarged heterogenous thyroid containing multiple nodules and masses, consistent with multinodular adenomatous goiter, causing some deviation of the trachea and concordant with soft tissue abnormality on CXR, no neck LAD   - CT chest - tiny left lower lobe pulmonary nodule - likely a small granuloma/scar. In a low risk patient no further follow up recommended   - TSH wnl this admit   - endo consulted for further tests, likely need to establish care with endo as outpatient as well   - given tobacco history (chew) will have pt follow up with PCP regarding the tiny left lower lobe nodule       ? Coronary artery disease 05/18/2011     LHC 12/21/10:Subtotal 99 percent proximal LAD lesion, status post successful PCI with 2 overlapping drug-eluting stents, Xience 3.0 x 15 mm and a Xience 2.5 x 15 mm postdilated to 3.25 mm with excellent resul Normal LVEDP. Normal LV systolic function with an EF of 55 percent  LHC 09/12/11:A 95 percent proximal LAD lesion status post PCI with a Xience 3.5 x 12 mm overlapping with the previously placed proximal LAD stent and postdilated with a 3.5 mm balloon with excellent results, Mild disease in the left circumflex and the distal LAD.  Elevated LVEDP, Normal LV systolic function, EF of 55 percent.       ? Tobacco chew use 01/17/2011   ? Hyperlipemia 01/17/2011   ? Poor dentition 01/17/2011   ? NSTEMI (non-ST elevated myocardial infarction) (HCC) 12/20/2010   ? HTN (hypertension) 12/20/2010         Review of Systems   Constitutional: Positive for decreased appetite and malaise/fatigue.   HENT: Negative.    Eyes: Negative. Cardiovascular: Positive for claudication.   Respiratory: Negative.    Endocrine: Negative.    Hematologic/Lymphatic: Bruises/bleeds easily.   Skin: Positive for poor wound healing.   Musculoskeletal: Positive for arthritis, joint pain, muscle weakness, myalgias, neck pain and stiffness.   Gastrointestinal: Positive for heartburn.   Genitourinary: Negative.    Neurological: Positive for weakness.   Psychiatric/Behavioral: The patient has insomnia.    Allergic/Immunologic: Negative.        Physical Exam  Awake and alert, no acute distress, burly gentleman, there is accompanied by his wife.  Does have a cane  Pupils are equal reactive without scleral injection  Neck is supple with normal carotid upstroke and no bruits, masses, jugular venous abnormalities  Chest is symmetric and lungs clear to auscultation  Heart S1, S2 that are normal.  No murmurs, clicks, or gallops  Abdomen is obese but nontender with positive bowel sounds  Pulses are 2+, regular, and symmetric bilaterally radial locations,  Decreased hair growth at the lower legs with otherwise symmetric muscle tone and skin turgor    Cardiovascular Studies      Cardiovascular Health Factors  Vitals BP Readings from Last 3 Encounters:   02/16/22 (!) 146/82   01/27/21 138/82   02/05/20 136/82     Wt Readings from Last 3 Encounters:   02/16/22 84.5 kg (186 lb 3.2 oz)   01/27/21 83 kg (183 lb)   02/05/20 85.7 kg (189 lb)     BMI Readings from Last 3 Encounters:   02/16/22 29.16 kg/m?   01/27/21 28.66 kg/m?   02/05/20 29.60 kg/m?      Smoking Social History     Tobacco Use   Smoking Status Never   Smokeless Tobacco Current   ? Types: Chew      Lipid Profile Cholesterol   Date Value Ref Range Status   05/23/2021 171  Final     HDL   Date Value Ref Range Status   05/23/2021 62  Final     LDL   Date Value Ref Range Status   05/23/2021 85  Final     Triglycerides   Date Value Ref Range Status   05/23/2021 121  Final      Blood Sugar Hemoglobin A1C   Date Value Ref Range Status   05/07/2020 6.1 (H) <5.7 Final     Glucose   Date Value Ref Range Status   05/23/2021 94  Final   05/07/2020 126 (H) 65 - 99 Final   06/13/2017 105 (H) 65 - 99 Final          Problems Addressed Today  Encounter Diagnoses   Name Primary?   ? CAD S/P percutaneous coronary angioplasty Yes   ? Tobacco chew use    ? Preop cardiovascular exam    ?  Pain in both lower extremities    ? Essential hypertension        Assessment and Plan     Overall heart and vascular findings are stable.  We will see if we can optimize his medications to ease his ability to tolerate the medications and limit potential side effects.  We will stop his aspirin and maintain him on clopidogrel 75 mg p.o. daily.  We will change his lisinopril to the 40 mg tablet daily, changes Toprol tartrate to Toprol-XL 100 mg p.o. daily.  We will place him on rosuvastatin 10 mg tablet daily.  I am going to arrange for him to have a nuclear cardiac stress test to reassess his LV function as well as for findings of ischemia considering recent episodes of chest discomfort.  Because of his leg pain, color changes will plan for ankle-brachial index studies with duplex ultrasound of the arteries in the legs bilaterally.  We will notify him of the results and determine further follow-up based upon those findings.  Would advise initial evaluation for potential surgery at this time.  If his stress test does not reveal ischemia and he is otherwise tolerating the medications could pursue that surgery as needed.         Current Medications (including today's revisions)  ? clopiDOGrel (PLAVIX) 75 mg tablet TAKE ONE TABLET BY MOUTH ONCE DAILY   ? famotidine (PEPCID) 20 mg tablet Take one tablet by mouth twice daily.   ? meloxicam (MOBIC) 15 mg tablet Take one tablet by mouth daily.   ? nitroglycerin (NITROSTAT) 0.4 mg tablet Place one tablet under tongue every 5 minutes as needed.     ? tadalafiL (CIALIS) 10 mg tablet Take one tablet by mouth as Needed.

## 2022-04-07 ENCOUNTER — Encounter: Admit: 2022-04-07 | Discharge: 2022-04-07 | Payer: MEDICARE

## 2022-04-07 NOTE — Telephone Encounter
-----   Message from Asencion Noble sent at 02/27/2022  1:10 PM CDT -----  Regarding: stress test  Wife says pt not planning on having done to end of OCT call if not scheduled

## 2022-04-07 NOTE — Telephone Encounter
Called wife she will call [facility] to schedule

## 2022-04-27 ENCOUNTER — Encounter: Admit: 2022-04-27 | Discharge: 2022-04-27 | Payer: MEDICARE

## 2022-04-27 ENCOUNTER — Ambulatory Visit: Admit: 2022-04-27 | Discharge: 2022-04-27 | Payer: MEDICARE

## 2022-04-27 DIAGNOSIS — Z01810 Encounter for preprocedural cardiovascular examination: Secondary | ICD-10-CM

## 2022-04-27 DIAGNOSIS — Z720 Tobacco use: Secondary | ICD-10-CM

## 2022-04-27 DIAGNOSIS — I10 Essential (primary) hypertension: Secondary | ICD-10-CM

## 2022-04-27 DIAGNOSIS — I739 Peripheral vascular disease, unspecified: Secondary | ICD-10-CM

## 2022-04-27 DIAGNOSIS — M79604 Pain in right leg: Secondary | ICD-10-CM

## 2022-04-27 DIAGNOSIS — M79606 Pain in leg, unspecified: Secondary | ICD-10-CM

## 2022-04-27 DIAGNOSIS — I2510 Atherosclerotic heart disease of native coronary artery without angina pectoris: Secondary | ICD-10-CM

## 2022-04-28 ENCOUNTER — Encounter: Admit: 2022-04-28 | Discharge: 2022-04-28 | Payer: MEDICARE

## 2022-04-28 NOTE — Telephone Encounter
-----   Message from Valora Piccolo, RN sent at 04/28/2022  2:11 PM CST -----    ----- Message -----  From: Levora Angel, MD  Sent: 04/28/2022   1:58 PM CST  To: [facility] Nurse Gen Card Team [place]    Please notify the a.m. that his stress test looked good.  No findings for significant ischemia and heart function continues to be stable.  Thank you  ----- Message -----  From: Levora Angel, MD  Sent: 04/28/2022  11:41 AM CST  To: Levora Angel, MD

## 2022-04-28 NOTE — Telephone Encounter
Called and discussed results and recommendations with pt's spouse.  She asked if patient was okay to proceed with surgery.  He is scheduled for knee replacement on 12/26 with Dr. Para March at [facility].    Will route to [facility] for recommendations.

## 2022-08-22 ENCOUNTER — Encounter: Admit: 2022-08-22 | Discharge: 2022-08-22 | Payer: MEDICARE

## 2022-08-22 MED ORDER — METOPROLOL SUCCINATE 100 MG PO TB24
100 mg | ORAL_TABLET | Freq: Every day | ORAL | 3 refills | 90.00000 days | Status: AC
Start: 2022-08-22 — End: ?

## 2023-04-23 ENCOUNTER — Encounter: Admit: 2023-04-23 | Discharge: 2023-04-23 | Payer: MEDICARE

## 2023-04-24 ENCOUNTER — Encounter: Admit: 2023-04-24 | Discharge: 2023-04-24 | Payer: MEDICARE

## 2023-04-24 ENCOUNTER — Ambulatory Visit: Admit: 2023-04-24 | Discharge: 2023-04-24 | Payer: MEDICARE

## 2023-04-24 DIAGNOSIS — I214 Non-ST elevation (NSTEMI) myocardial infarction: Secondary | ICD-10-CM

## 2023-04-24 DIAGNOSIS — Z289 Immunization not carried out for unspecified reason: Secondary | ICD-10-CM

## 2023-04-24 DIAGNOSIS — I10 Essential (primary) hypertension: Secondary | ICD-10-CM

## 2023-04-24 DIAGNOSIS — E7800 Pure hypercholesterolemia, unspecified: Secondary | ICD-10-CM

## 2023-04-24 DIAGNOSIS — Z720 Tobacco use: Secondary | ICD-10-CM

## 2023-04-24 DIAGNOSIS — I2510 Atherosclerotic heart disease of native coronary artery without angina pectoris: Secondary | ICD-10-CM

## 2023-04-24 DIAGNOSIS — E042 Nontoxic multinodular goiter: Secondary | ICD-10-CM

## 2023-04-24 DIAGNOSIS — I25118 Atherosclerotic heart disease of native coronary artery with other forms of angina pectoris: Secondary | ICD-10-CM

## 2023-04-24 DIAGNOSIS — Z136 Encounter for screening for cardiovascular disorders: Secondary | ICD-10-CM

## 2023-04-24 NOTE — Progress Notes
Date of Service: 04/24/2023    Cristobal Scelfo is a 67 y.o. male.       HPI     Cristos Yazzie is a 67 y.o.  white male with a history of CAD, history of MI and PCI to LAD in July 2012, history of recurrent chest pain, status post repeat left heart catheterization April 2013, and at that time PCI of the proximal LAD, initially started on prasugrel and then later on was continued on clopidogrel along with aspirin, patient also has a history of hypertension, hyperlipidemia, multinodular goiter, osteoarthritis status post left knee replacement in 2013, patient is scheduled to undergo right knee replacement on 05/29/2023.    Patient has not had any symptoms of chest pain, heart palpitations, presyncope or syncope.    In September 2023 he was seen in the office by one of my colleagues, he was taken off aspirin and continues on clopidogrel.    Patient was evaluated with a perfusion imaging study in November 2023, the perfusion pattern did not demonstrate ischemia, normal LVEF = 63%, there were no ischemic EKG changes.       Vitals:    04/24/23 1032   BP: (!) 164/95   BP Source: Arm, Left Upper   Pulse: 65   SpO2: 98%   O2 Device: None (Room air)   PainSc: Zero   Weight: 86.3 kg (190 lb 3.2 oz)   Height: 170.2 cm (5' 7)     Body mass index is 29.79 kg/m?Marland Kitchen     Past Medical History  Patient Active Problem List    Diagnosis Date Noted    Preop cardiovascular exam 01/27/2021    COVID-19 vaccine dose not administered 02/05/2020    CAD S/P percutaneous coronary angioplasty     Essential hypertension 01/21/2015    Chest pain 09/13/2011    Mediastinal abnormality 09/13/2011    Multinodular goiter 09/13/2011     Enlarged Mediastinum on CXR - consistent with multinodular goiter   - CXR on 09/11/11 showed enlargement left superior mediastinum, consistent with thyroid enlargement, LAD, or mass. Increased soft tissue density overlying the right supraclavicular region, possibly LAD.   - Repeat CT 09/13/11 neck demonstrated enlarged heterogenous thyroid containing multiple nodules and masses, consistent with multinodular adenomatous goiter, causing some deviation of the trachea and concordant with soft tissue abnormality on CXR, no neck LAD   - CT chest - tiny left lower lobe pulmonary nodule - likely a small granuloma/scar. In a low risk patient no further follow up recommended   - TSH wnl this admit   - endo consulted for further tests, likely need to establish care with endo as outpatient as well   - given tobacco history (chew) will have pt follow up with PCP regarding the tiny left lower lobe nodule        Coronary artery disease 05/18/2011     LHC 12/21/10:Subtotal 99 percent proximal LAD lesion, status post successful PCI with 2 overlapping drug-eluting stents, Xience 3.0 x 15 mm and a Xience 2.5 x 15 mm postdilated to 3.25 mm with excellent resul Normal LVEDP. Normal LV systolic function with an EF of 55 percent    LHC 09/12/11:A 95 percent proximal LAD lesion status post PCI with a Xience 3.5 x 12 mm overlapping with the previously placed proximal LAD stent and postdilated with a 3.5 mm balloon with excellent results, Mild disease in the left circumflex and the distal LAD.  Elevated LVEDP, Normal LV systolic function, EF of 55  percent.        Tobacco chew use 01/17/2011    Hyperlipemia 01/17/2011    Poor dentition 01/17/2011    NSTEMI (non-ST elevated myocardial infarction) (HCC) 12/20/2010    HTN (hypertension) 12/20/2010         Review of Systems   Constitutional: Negative.   HENT: Negative.     Eyes: Negative.    Cardiovascular: Negative.    Respiratory: Negative.     Endocrine: Negative.    Hematologic/Lymphatic: Negative.    Skin: Negative.    Musculoskeletal: Negative.    Gastrointestinal: Negative.    Genitourinary: Negative.    Neurological: Negative.    Psychiatric/Behavioral: Negative.     Allergic/Immunologic: Negative.        Physical Exam    General Appearance: normal in appearance  Skin: warm, moist, no ulcers or xanthomas  Eyes: conjunctivae and lids normal, pupils are equal and round  Lips & Oral Mucosa: no pallor or cyanosis  Neck Veins: neck veins are flat, neck veins are not distended  Chest Inspection: chest is normal in appearance  Respiratory Effort: breathing comfortably, no respiratory distress  Auscultation/Percussion: lungs clear to auscultation, no rales or rhonchi, no wheezing  Cardiac Rhythm: regular rhythm and normal rate  Cardiac Auscultation: S1, S2 normal, no rub, no gallop  Murmurs: no murmur  Carotid Arteries: normal carotid upstroke bilaterally, no bruit  Lower Extremity Edema: no lower extremity edema  Abdominal Exam: soft, non-tender, no masses, bowel sounds normal  Liver & Spleen: no organomegaly  Language and Memory: patient responsive and seems to comprehend information  Neurologic Exam: neurological assessment grossly intact    Cardiovascular Studies  Twelve-lead EKG demonstrates normal sinus rhythm, no ST segment T wave changes, ventricular rate 61 bpm, no axis deviation.    Cardiovascular Health Factors  Vitals BP Readings from Last 3 Encounters:   04/24/23 (!) 164/95   02/16/22 (!) 146/82   01/27/21 138/82     Wt Readings from Last 3 Encounters:   04/24/23 86.3 kg (190 lb 3.2 oz)   02/16/22 84.5 kg (186 lb 3.2 oz)   01/27/21 83 kg (183 lb)     BMI Readings from Last 3 Encounters:   04/24/23 29.79 kg/m?   02/16/22 29.16 kg/m?   01/27/21 28.66 kg/m?      Smoking Social History     Tobacco Use   Smoking Status Every Day   Smokeless Tobacco Current    Types: Chew      Lipid Profile Cholesterol   Date Value Ref Range Status   04/23/2023 151  Final     HDL   Date Value Ref Range Status   04/23/2023 62  Final     LDL   Date Value Ref Range Status   04/23/2023 72  Final     Triglycerides   Date Value Ref Range Status   04/23/2023 85  Final      Blood Sugar Hemoglobin A1C   Date Value Ref Range Status   05/07/2020 6.1 (H) <5.7 Final     Glucose   Date Value Ref Range Status   04/23/2023 125 (H) 70 - 105 Final   05/23/2021 94  Final   05/07/2020 126 (H) 65 - 99 Final          Problems Addressed Today  Encounter Diagnoses   Name Primary?    NSTEMI (non-ST elevated myocardial infarction) (HCC) Yes    Pure hypercholesterolemia     Primary hypertension  Essential hypertension     Coronary artery disease involving native coronary artery of native heart with other form of angina pectoris (HCC)     CAD S/P percutaneous coronary angioplasty     Tobacco chew use     Multinodular goiter     COVID-19 vaccine dose not administered     Screening for heart disease        Assessment and Plan     Assessment:    1.  Preoperative evaluation preceding R knee replacement  Patient is scheduled to undergo right knee replacement on 05/29/2023  2.  History of myocardial infarction --no symptoms of angina and no use of sublingual nitroglycerin  3.  Status post PCI to LAD in July 2012  4.  History of recurrent chest pain  5.  Status post repeat left heart catheterization and PCI to proximal LAD in April 2013-no recurrent symptoms of angina, no use of sublingual nitroglycerin, patient has been continued on dual antiplatelet therapy, he was initially on prasugrel and then was switched to clopidogrel  6.  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  Hyperlipidemia-on statin therapy  8.  Osteoarthritis  Status post left knee replacement in 2023  Scheduled to undergo right knee replacement on 05/29/2023      Plan:    1.  I asked the patient to discontinue the clopidogrel and start aspirin 81 mg p.o. daily  After the orthopedic surgical intervention of the right knee he will remain on aspirin, he does not need to resume clopidogrel  2.  I recommended a strict low-sodium diet  3.  Patient's Revised Cardiac Risk Index  is low for a low risk surgery.   Estimated risk of adverse outcomes (perioperative/postoperative events including myocardial infarction, pulmonary edema, ventricular fibrillation, cardiac arrest or complete heart block) with noncardiac surgery is l< 1% from a cardiac standpoint. This patient does not have any contraindication to undergo general anesthesia and the planned surgical procedure.  The full risks and benefits will have to be discussed with the Anesthesiologist  and the Surgeon that will perform the procedure.  Please contact us for any further questions.    4.  Follow-up office visit in July -- August 2025.  A 2D echo Doppler study will be performed prior to the next office visit.            Current Medications (including today's revisions)   clopiDOGrel (PLAVIX) 75 mg tablet TAKE ONE TABLET BY MOUTH ONCE DAILY    famotidine (PEPCID) 20 mg tablet Take one tablet by mouth twice daily.    lisinopriL (ZESTRIL) 40 mg tablet Take one tablet by mouth daily.    meloxicam (MOBIC) 15 mg tablet Take one tablet by mouth daily.    metoprolol succinate XL (TOPROL XL) 100 mg extended release tablet Take 1 tablet by mouth once daily    nitroglycerin (NITROSTAT) 0.4 mg tablet Place one tablet under tongue every 5 minutes as needed.      rosuvastatin (CRESTOR) 10 mg tablet Take one tablet by mouth daily.    tadalafiL (CIALIS) 10 mg tablet Take one tablet by mouth as Needed.

## 2023-07-30 ENCOUNTER — Encounter: Admit: 2023-07-30 | Discharge: 2023-07-30 | Payer: MEDICARE

## 2023-07-30 MED ORDER — METOPROLOL SUCCINATE 100 MG PO TB24
100 mg | ORAL_TABLET | Freq: Every day | ORAL | 3 refills | 90.00000 days | Status: AC
Start: 2023-07-30 — End: ?

## 2023-12-25 ENCOUNTER — Encounter: Admit: 2023-12-25 | Discharge: 2023-12-25 | Payer: MEDICARE
